# Patient Record
Sex: Male | Born: 1970 | Race: Black or African American | Hispanic: No | Smoking: Former smoker
Health system: Southern US, Academic
[De-identification: ages and names within clinical notes are randomized; demographics above are authoritative.]

---
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
## 2015-12-13 ENCOUNTER — Encounter (INDEPENDENT_AMBULATORY_CARE_PROVIDER_SITE_OTHER): Payer: Self-pay | Admitting: Family Medicine

## 2015-12-13 ENCOUNTER — Ambulatory Visit (HOSPITAL_BASED_OUTPATIENT_CLINIC_OR_DEPARTMENT_OTHER): Payer: No Typology Code available for payment source | Admitting: Family Medicine

## 2015-12-13 ENCOUNTER — Ambulatory Visit
Admission: RE | Admit: 2015-12-13 | Discharge: 2015-12-13 | Disposition: A | Discharge status: Home / Self Care | Payer: No Typology Code available for payment source | Source: Ambulatory Visit

## 2015-12-13 VITALS — BP 170/106 | HR 76 | Ht 69.76 in | Wt 242.1 lb

## 2015-12-13 DIAGNOSIS — M7661 Achilles tendinitis, right leg: Secondary | ICD-10-CM

## 2015-12-13 DIAGNOSIS — E119 Type 2 diabetes mellitus without complications: Secondary | ICD-10-CM | POA: Insufficient documentation

## 2015-12-13 DIAGNOSIS — M7660 Achilles tendinitis, unspecified leg: Secondary | ICD-10-CM

## 2015-12-13 DIAGNOSIS — Z7982 Long term (current) use of aspirin: Secondary | ICD-10-CM | POA: Insufficient documentation

## 2015-12-13 DIAGNOSIS — S86019A Strain of unspecified Achilles tendon, initial encounter: Secondary | ICD-10-CM

## 2015-12-13 DIAGNOSIS — Z79899 Other long term (current) drug therapy: Secondary | ICD-10-CM | POA: Insufficient documentation

## 2015-12-13 DIAGNOSIS — M19071 Primary osteoarthritis, right ankle and foot: Secondary | ICD-10-CM

## 2015-12-13 DIAGNOSIS — S86011D Strain of right Achilles tendon, subsequent encounter: Secondary | ICD-10-CM | POA: Insufficient documentation

## 2015-12-13 DIAGNOSIS — Z7984 Long term (current) use of oral hypoglycemic drugs: Secondary | ICD-10-CM | POA: Insufficient documentation

## 2015-12-13 DIAGNOSIS — M25771 Osteophyte, right ankle: Secondary | ICD-10-CM

## 2015-12-13 DIAGNOSIS — M7731 Calcaneal spur, right foot: Secondary | ICD-10-CM

## 2015-12-14 NOTE — H&P (Signed)
PATIENT NAME: Mike Mike [name], Mike Mike [name]  HOSPITAL NUMBER:  [id number]  DATE OF SERVICE: 12/13/2015  DATE OF BIRTH:  1970/11/28    HISTORY AND PHYSICAL    HISTORY OF PRESENT ILLNESS:  Mike Mike [name] is Mike [name] new patient who presents today regarding Mike [name] right Achilles tendon rupture that he sustained in March 2017 when playing basketball with his nephew.  He states he [facility] by Dr. Mathews RobinsonsHogan, treated nonoperatively in Mike [name] CAM boot with wedges for 4-6 weeks.  He states after the wedges were completely removed he was then progressed into Mike [name] brace, which he describes as an Achillotrain brace and was prescribed physical therapy for rehabilitation; however, he was unable to complete the physical therapy due to being [facility] at [facility].  He is here today to discuss his rehabilitation process and is present with physical therapy protocol for Mike [name] ruptured Achilles from Dr. Mathews RobinsonsHogan.  He has no pain but does complain of the scar tissue tenderness at the area of rupture.    PAST MEDICAL HISTORY:  No medical problems are listed on the intake sheet.    CURRENT MEDICATIONS:  1. Lisinopril.   2. Baby aspirin.   3. Metformin. The patient states that he was recently diagnosed as diabetic due to his increase in weight gain.    PAST SURGICAL HISTORY:  Knee surgery.    ALLERGIES:  He is allergic to penicillin.  He has no other allergies listed.    SOCIAL HISTORY:  He does not smoke or chew tobacco.  Does not drink alcohol.  Work and marital status is unknown.  [facility].    FAMILY HISTORY:  Positive for diabetes and high blood pressure.    REVIEW OF SYSTEMS:  As per HPI, all other systems reviewed and are negative.    PHYSICAL EXAMINATION:  The patient is in no acute distress.  Vital signs:  Blood pressure is 170/106, pulse 76, temperature was not obtained, height 177.2 cm, weight 109.8 kg.  The patient is atraumatic, normocephalic, nonlabored breath sounds.  No lymphedema.  Mood is appropriate for clinical  situation.  Skin, there are no rashes or pruritus noted.  On exam of his right lower extremity, he does have increased scar tissue build up of the Achilles mid substance.  Thompson test is negative.  He has no open wounds, abrasions or signs of infection.  He has great range of motion, roughly full dorsiflexion and plantarflexion.    IMAGING:  X-rays were taken at today's visit.  No fractures or abnormalities seen.    ASSESSMENT:  Right Achilles rupture, date of injury July 21, 2015, treated nonoperatively.    PLAN:  Mike [name] prescription for physical therapy with Achilles tendon rupture protocol was provided to him at today's visit.  I would like for him to refrain from any type of running activities such as basketball  and football.  Those restrictions were provided to him in letter form today.  Mike [name] prescription for an Achillotrain sleeve was also given.  Will follow up with him again 2 months.  Hopefully my [facility] will be [facility].  Otherwise, Mike [name] followup visit will not be needed.     The patient was seen independently by myself.        Marnee GuarneriJessica Ann Rhodes, PA-C  [place] [facility]              DD:  12/13/2015 12:49:55  DT:  12/14/2015  21:07:56 KG  D#:  [id number]          Tarry Kosobert Dale Anneka Studer, MD  Associate Professor  Chief of Foot & Ankle Surgery  Department of Orthopaedics  [facility] [employer] [facility] [employer]  01/03/2016, 07:55

## 2016-02-09 ENCOUNTER — Encounter (INDEPENDENT_AMBULATORY_CARE_PROVIDER_SITE_OTHER): Payer: Self-pay | Admitting: Family Medicine

## 2016-02-09 ENCOUNTER — Ambulatory Visit: Payer: No Typology Code available for payment source | Admitting: Family Medicine

## 2016-02-09 DIAGNOSIS — S86019D Strain of unspecified Achilles tendon, subsequent encounter: Secondary | ICD-10-CM | POA: Insufficient documentation

## 2016-02-09 DIAGNOSIS — Z9119 Patient's noncompliance with other medical treatment and regimen: Secondary | ICD-10-CM | POA: Insufficient documentation

## 2016-02-09 DIAGNOSIS — X58XXXD Exposure to other specified factors, subsequent encounter: Secondary | ICD-10-CM | POA: Insufficient documentation

## 2016-02-09 DIAGNOSIS — S86019A Strain of unspecified Achilles tendon, initial encounter: Secondary | ICD-10-CM

## 2016-02-11 NOTE — Progress Notes (Signed)
PATIENT NAME: Mike [name], Quintyn A  HOSPITAL NUMBER:  [id number]  DATE OF SERVICE: 02/09/2016  DATE OF BIRTH:  May 02, 1970    PROGRESS NOTE    SUBJECTIVE:  Criss Alvinerince is here for followup regarding Achilles tendon rupture repair treated conservatively and he was supposed to be in physical therapy.  Of course, he only went to 1 therapy visit because he is a prisoner and apparently they do not adhere to the orders that the doctor has given, so his functional rehabilitation has been virtually 0.  Despite that, he has done quite well and is in a pleasant mood.  There is no signs of infection.  The tendon appears to be palpably intact in a normal Thompson's test, although strength lacks behind the normal side.    IMPRESSION AND PLAN:  Status post Achilles tendon rupture treated conservatively without the appropriate physical therapy.  I reordered the physical therapy and written it is exactly as I would want it. Followup will be with us in 2 months.        Tarry Kosobert Dale Alondra Sahni, MD  Associate Professor   [facility]              DD:  02/09/2016 15:01:32  DT:  02/11/2016 13:36:35 LS  D#:  [id number]

## 2016-02-13 ENCOUNTER — Encounter (INDEPENDENT_AMBULATORY_CARE_PROVIDER_SITE_OTHER): Payer: No Typology Code available for payment source | Admitting: Family Medicine

## 2021-03-01 IMAGING — MR MRI WRIST RT W/O CONTRAST
5 of 9 series · 20 of 40 positions shown · non-contrast
Comparison: none

﻿

Pertinent Hx:  Injury to the wrist 11/15/2020.  Wrist pain.
TECHNIQUE: Images were taken in axial, coronal and sagittal planes.  T1 and T2-weighted imaging was performed.

[Series 3: PD fat-sat · axial · 4.0mm · 0.23mm/px · z∈[-74,+8]mm · 4 of 23 slices shown (1 of 4)]
[im 1/23]
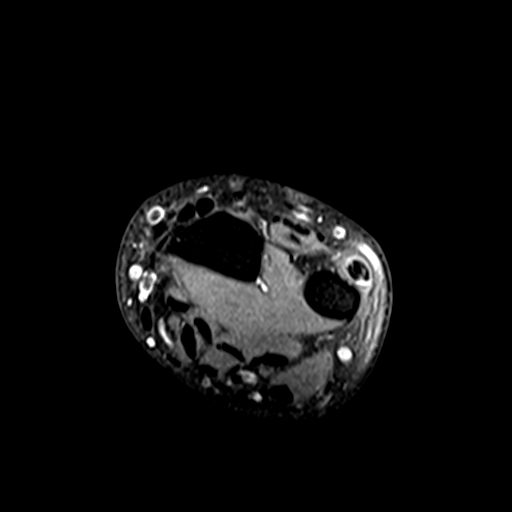
[im 8/23]
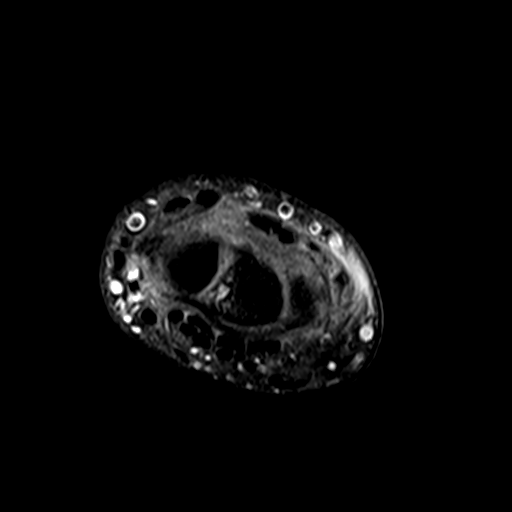
[im 15/23]
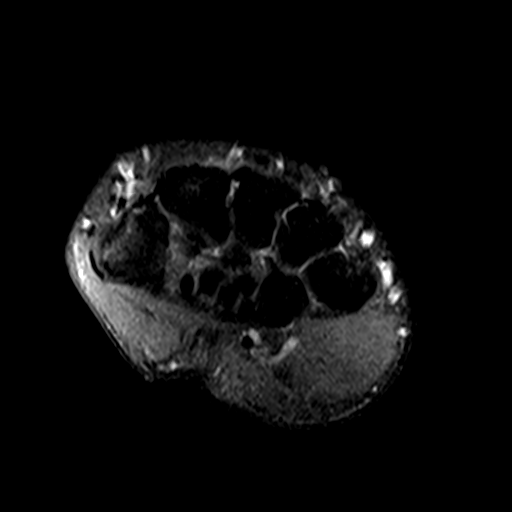
[im 23/23]
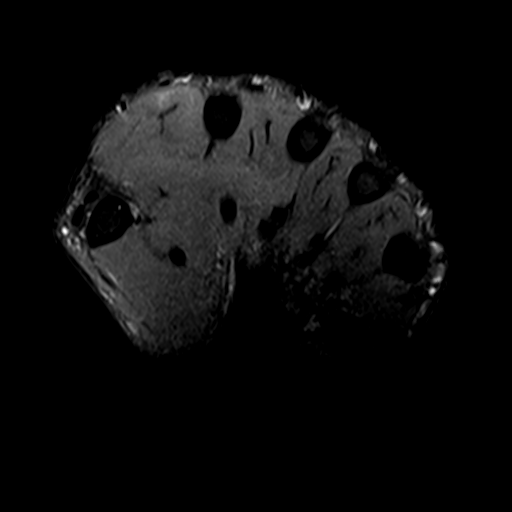

[Series 5: T1 · axial · 4.0mm · 0.23mm/px · 1 of 23 slices shown]
[im 1/23]
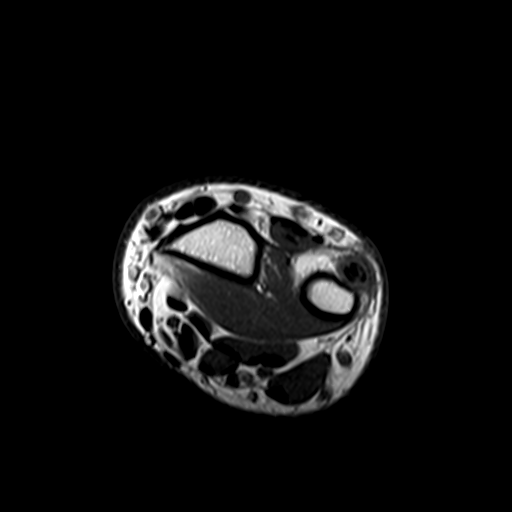

[Series 6: PD fat-sat · sagittal · 3.0mm · 0.23mm/px · 5 of 25 slices shown (2 of 4)]
[im 1/25]
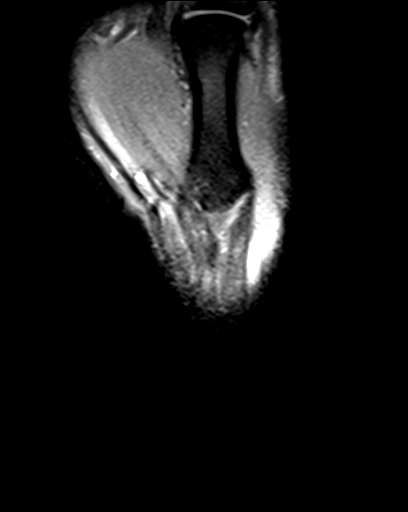
[im 7/25]
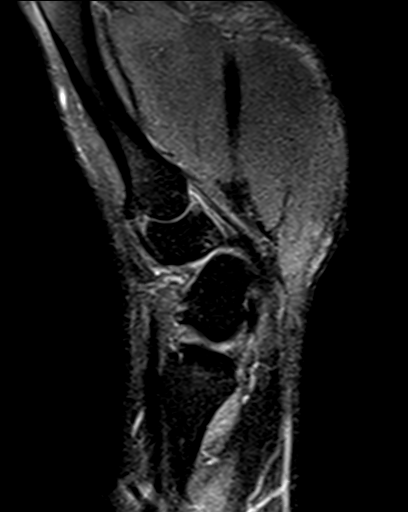
[im 13/25]
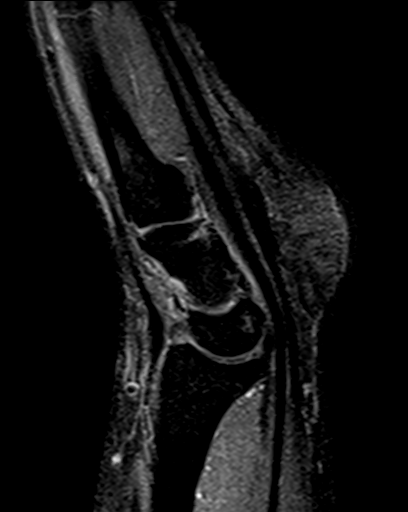
[im 19/25]
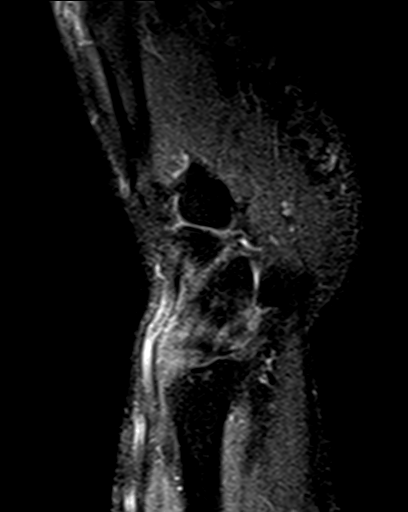
[im 25/25]
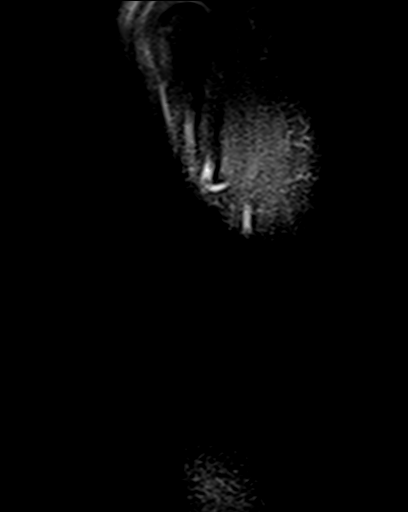

[Series 9: PD fat-sat · coronal · 3.0mm · 0.23mm/px · 5 of 22 slices shown (3 of 4)]
[im 1/22]
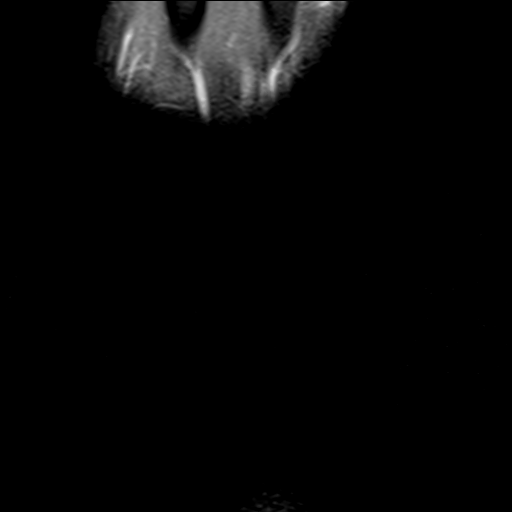
[im 6/22]
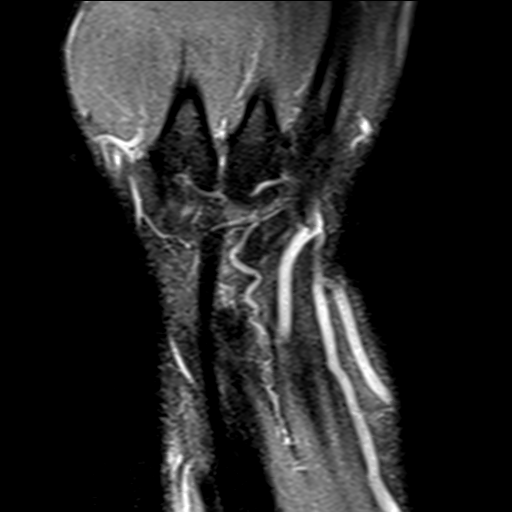
[im 11/22]
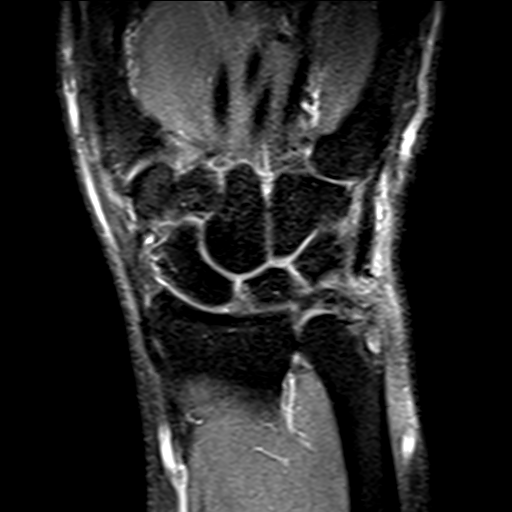
[im 16/22]
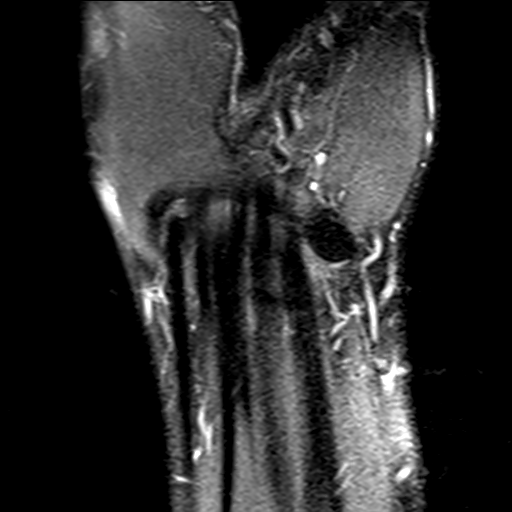
[im 22/22]
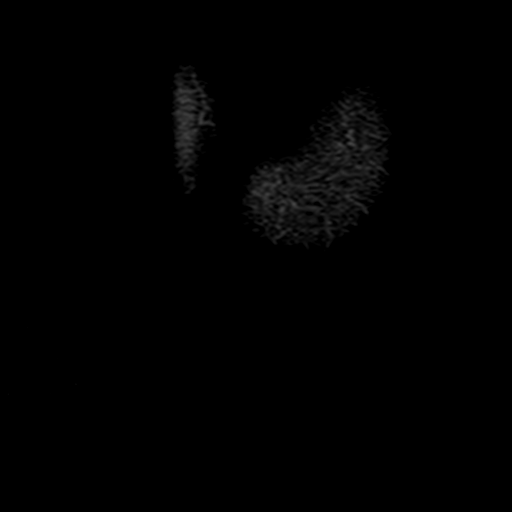

[Series 10: PD fat-sat · axial · 4.0mm · 0.23mm/px · z∈[-87,-5]mm · 5 of 23 slices shown (4 of 4)]
[im 1/23]
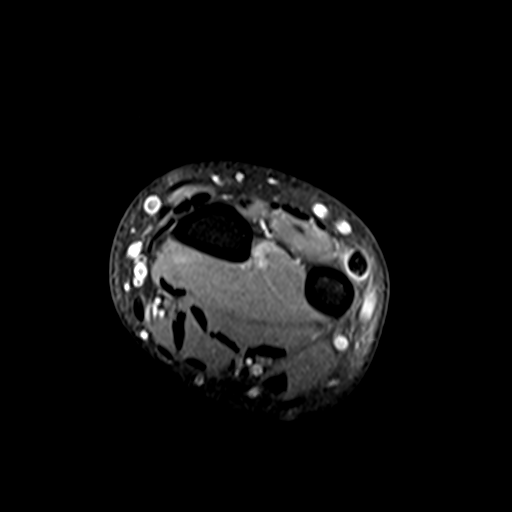
[im 6/23]
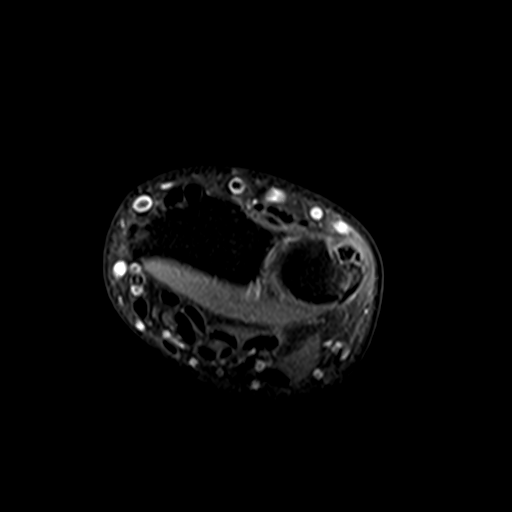
[im 12/23]
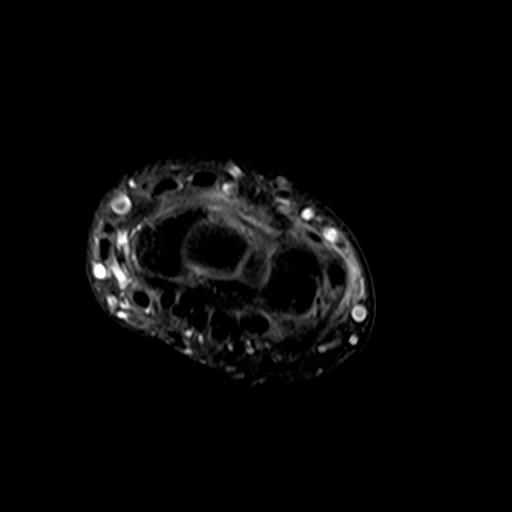
[im 17/23]
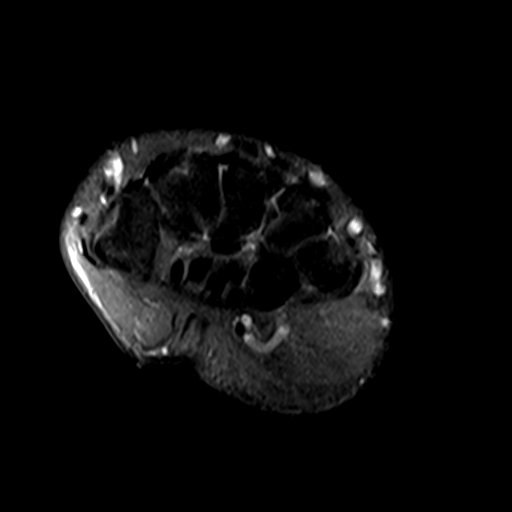
[im 23/23]
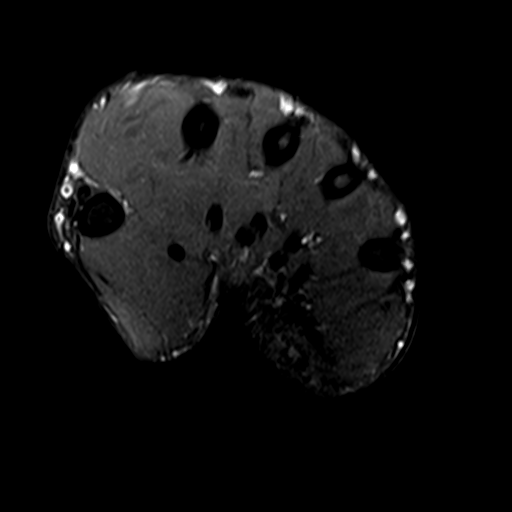

[20 of 40 positions shown; findings below may reference images not displayed]

FINDINGS: Fluid is present within the sheath of the extensor carpi ulnaris tendon.  There is a partial thickness tear within the tendon at the level of the ulnar styloid.  The tendon is split by the tear but incompletely ruptured.  No other tendon abnormality can be identified.  

There is a tear within the substance of the triangular fibrocartilage.  The tear is nondisplaced.  Other components of the triangular fibrocartilage complex appear normal.  

There is a degenerative cystic area within the ulnar styloid surrounded by a small amount of bone marrow edema.  

There are some degenerative cystic changes within the lunate close to the scapholunate articulation.  

No ligament tear can be identified.  

No evidence of fracture.  

No other bone or soft tissue abnormality identified.
IMPRESSION: 1. Partial thickness tear within the extensor carpi ulnaris tendon close to the ulnar styloid.  Fluid within the tendon sheath.  No evidence of complete tendon rupture. 

2. Nondisplaced tear within the substance of the triangular fibrocartilage.  

3. Degenerative cystic change within the ulnar styloid.  Small degenerative cysts within the lunate.

## 2021-07-19 IMAGING — MR ARTHROGRAM MRI RT WRIST
4 of 7 series · 18 of 40 positions shown · IV contrast (multihance)
Comparison: none

﻿

Pertinent Hx:  Continued wrist pain.
TECHNIQUE: Using sterile technique and local anesthesia and with fluoroscopic guidance, a 25-gauge needle was placed within a recess of the radiocarpal joint.  Approximately 2.5 mL of a dilute mixture of Multihance mixed in normal saline were injected into the wrist along with a small amount of Isovue. 
T1-weighted coronal, axial, and sagittal images of the wrist were taken.  Proton density axial and T2-weighted coronal images and proton density coronal images were also obtained.  Total fluoro time:  0.5 minutes.

[Series 3: T1 · axial · 3.0mm · 0.21mm/px · z∈[-31,+26]mm · 3 of 30 slices shown (1 of 2)]
[im 5/30]
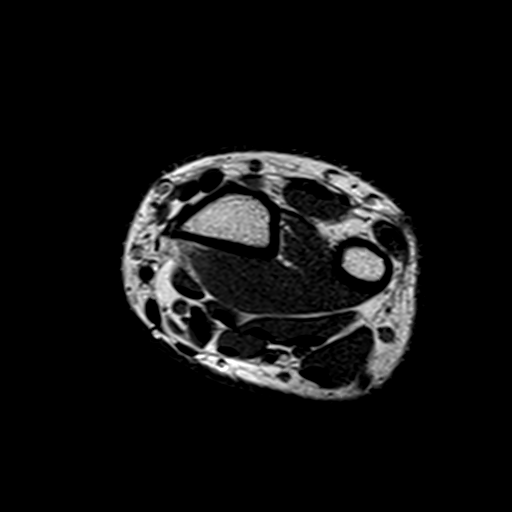
[im 17/30]
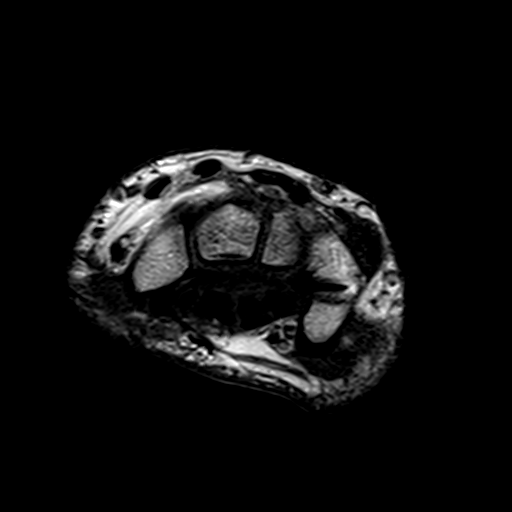
[im 25/30]
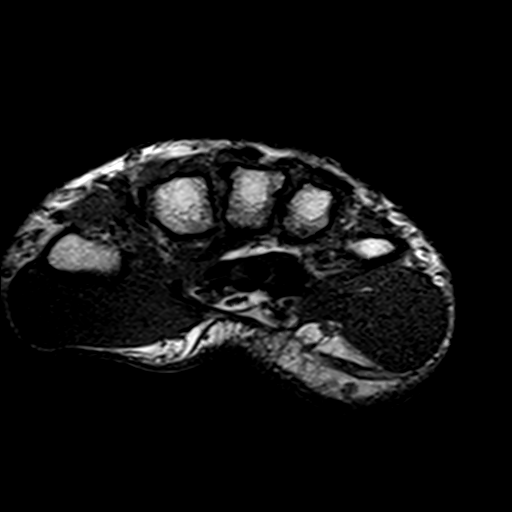

[Series 4: PD fat-sat · axial · 3.0mm · 0.21mm/px · z∈[-43,+40]mm · 8 of 30 slices shown (1 of 2)]
[im 1/30]
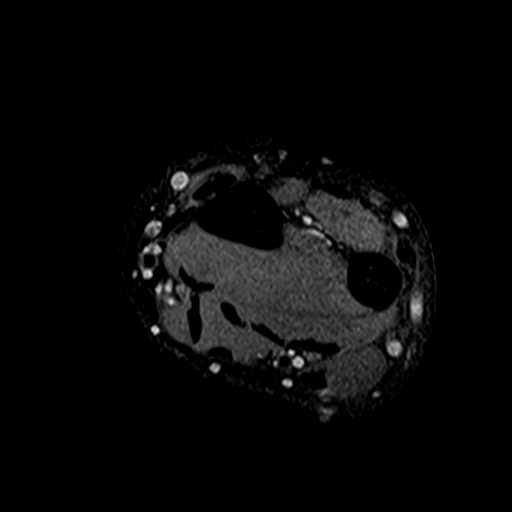
[im 5/30]
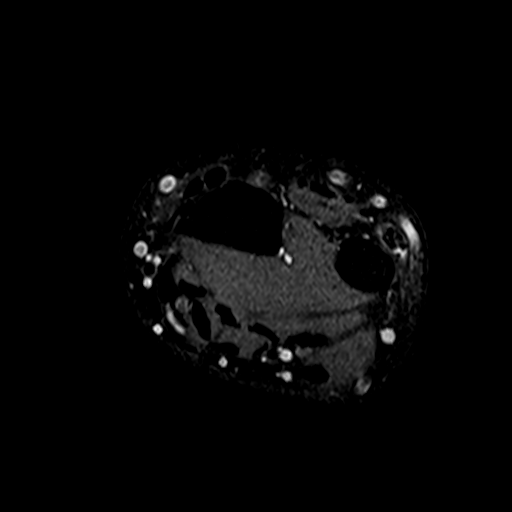
[im 9/30]
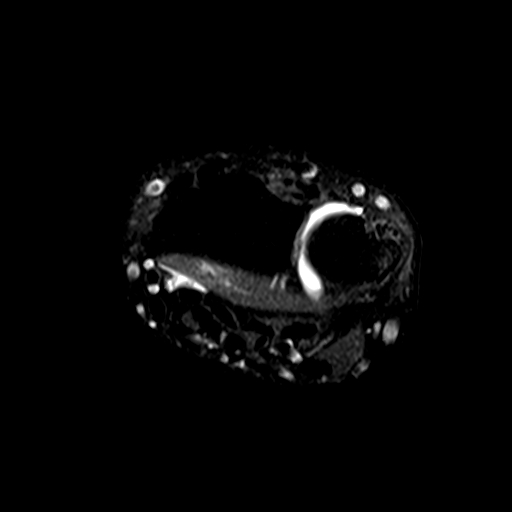
[im 13/30]
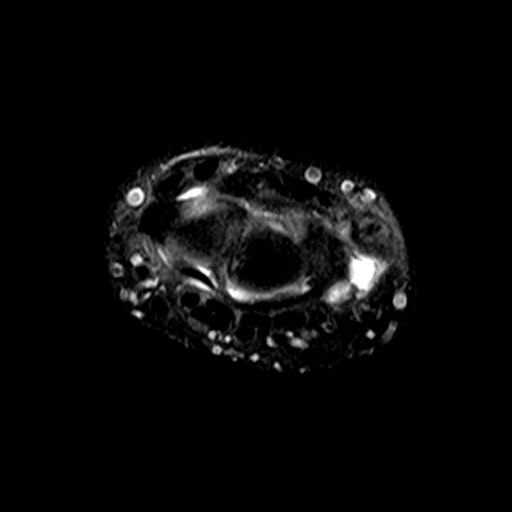
[im 17/30]
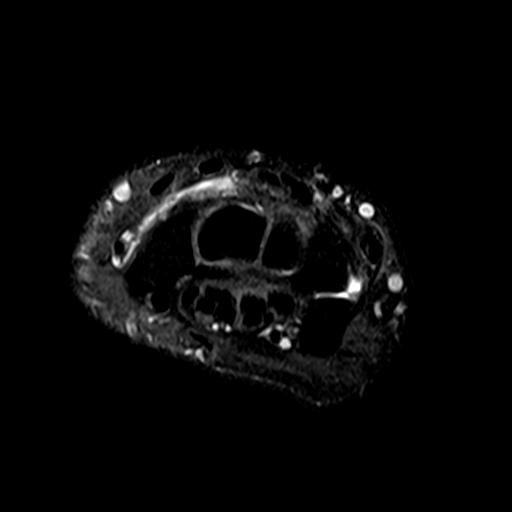
[im 21/30]
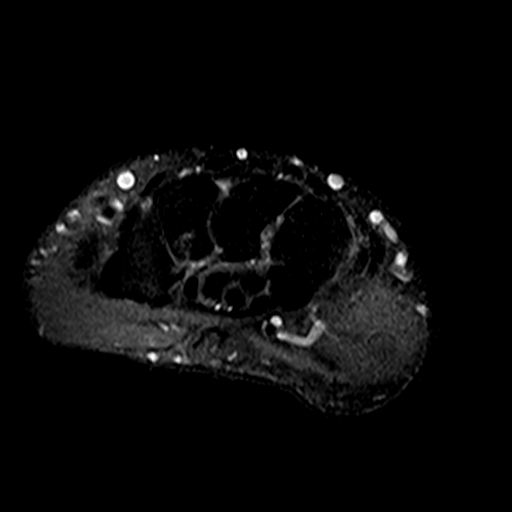
[im 25/30]
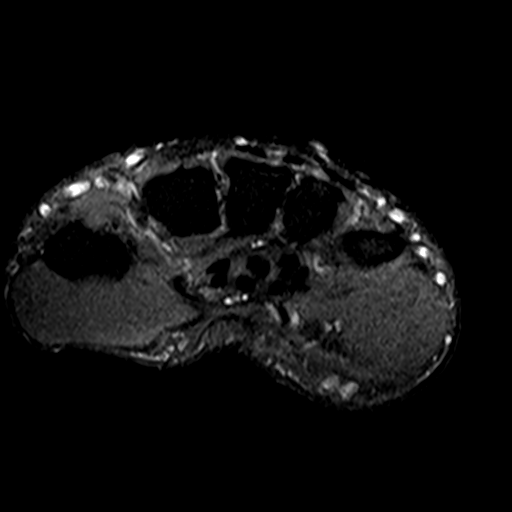
[im 30/30]
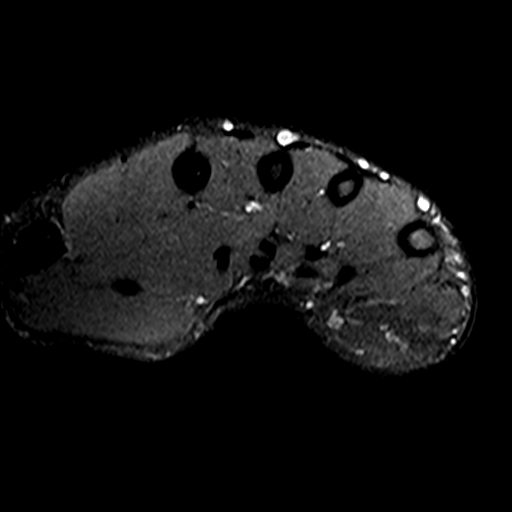

[Series 5: T1 · coronal · 3.0mm · 0.23mm/px · 3 of 20 slices shown (2 of 2)]
[im 1/20]
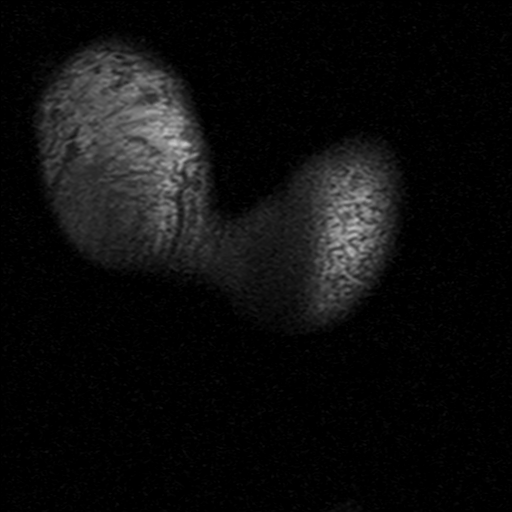
[im 10/20]
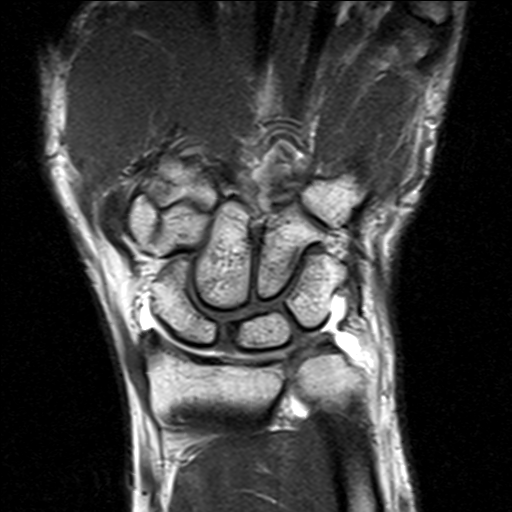
[im 20/20]
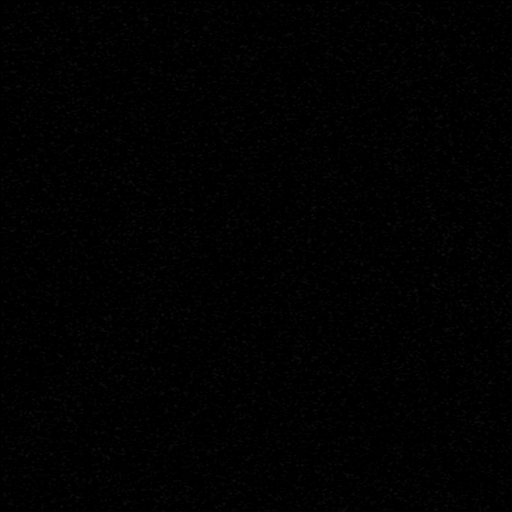

[Series 6: PD fat-sat · coronal · 3.0mm · 0.23mm/px · 4 of 20 slices shown (2 of 2)]
[im 1/20]
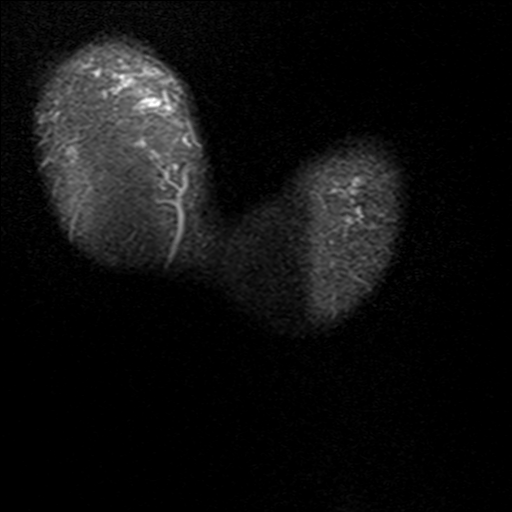
[im 5/20]
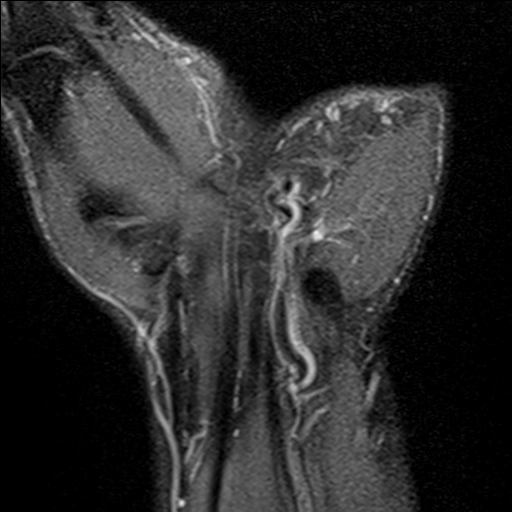
[im 10/20]
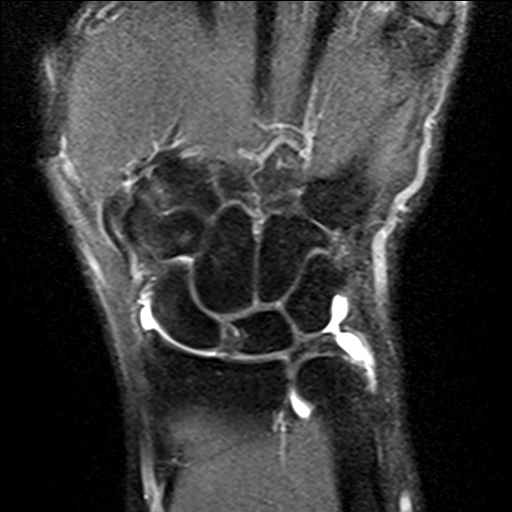
[im 20/20]
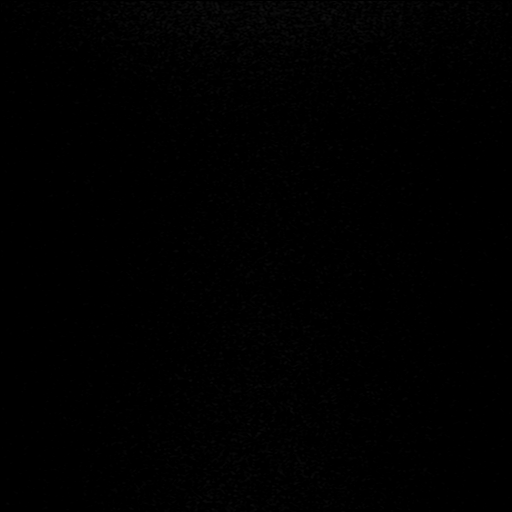

[18 of 40 positions shown; findings below may reference images not displayed]

FINDINGS: There is a complete tear through the substance of the triangular fibrocartilage.  This allowed communication of contrast material from the radiocarpal joint into the distal radioulnar joint. 

There is evidence of extensor carpi ulnaris tendinopathy with thickening of the tendon and with areas of abnormal signal.  There is a small amount of fluid within the tendon sheath.  There is an extensive partial tear of the tendon close to the ulnar styloid.  The tear splits the tendon and is similar in appearance to a study of 03/01/2021.  There is no evidence of complete rupture.  

A small area of cystic change in the ulnar styloid is unchanged from previously.  

No ligament tear can be identified. 

No other tendon abnormality identified. 

Minor cystic change within the lunate close to the scaphoid is unchanged.  

No other abnormality identified.  

Impression on page two
IMPRESSION: 1. Tear of the triangular fibrocartilage.  This tear through the substance of the triangular cartilage allowed communication of contrast material from the radiocarpal joint into the distal radioulnar joint.  

2. Extensive carpi ulnaris tendinopathy.  Partial tear of the extensor carpi ulnaris tendon.  Fluid within the tendon sheath.  No change in the extent of the tear compared to the study of 03/01/2021.  

3. Cystic change within the ulnar styloid retrospectively unchanged.  

4. No other tear identified.
